# Patient Record
Sex: Male | Born: 1980 | Hispanic: Yes | Marital: Single | Smoking: Current some day smoker
Health system: Southern US, Community
[De-identification: ages and names within clinical notes are randomized; demographics above are authoritative.]

## PROBLEM LIST (undated history)

## (undated) ENCOUNTER — Emergency Department (HOSPITAL_COMMUNITY): Payer: Self-pay | Source: Home / Self Care

## (undated) DIAGNOSIS — J45909 Unspecified asthma, uncomplicated: Secondary | ICD-10-CM

---

## 2002-02-26 ENCOUNTER — Emergency Department (HOSPITAL_COMMUNITY): Admission: EM | Admit: 2002-02-26 | Discharge: 2002-02-26 | Payer: Self-pay | Admitting: Emergency Medicine

## 2002-03-06 ENCOUNTER — Emergency Department (HOSPITAL_COMMUNITY): Admission: EM | Admit: 2002-03-06 | Discharge: 2002-03-06 | Payer: Self-pay | Admitting: *Deleted

## 2006-09-22 ENCOUNTER — Emergency Department (HOSPITAL_COMMUNITY): Admission: EM | Admit: 2006-09-22 | Discharge: 2006-09-22 | Payer: Self-pay | Admitting: Surgery

## 2014-02-28 ENCOUNTER — Other Ambulatory Visit: Payer: Self-pay

## 2014-02-28 ENCOUNTER — Emergency Department (HOSPITAL_COMMUNITY): Payer: Self-pay

## 2014-02-28 ENCOUNTER — Emergency Department (HOSPITAL_COMMUNITY)
Admission: EM | Admit: 2014-02-28 | Discharge: 2014-02-28 | Disposition: A | Discharge status: Home / Self Care | Payer: Self-pay | Attending: Emergency Medicine | Admitting: Emergency Medicine

## 2014-02-28 ENCOUNTER — Encounter (HOSPITAL_COMMUNITY): Payer: Self-pay | Admitting: Emergency Medicine

## 2014-02-28 DIAGNOSIS — Z23 Encounter for immunization: Secondary | ICD-10-CM | POA: Insufficient documentation

## 2014-02-28 DIAGNOSIS — Z792 Long term (current) use of antibiotics: Secondary | ICD-10-CM | POA: Insufficient documentation

## 2014-02-28 DIAGNOSIS — F17200 Nicotine dependence, unspecified, uncomplicated: Secondary | ICD-10-CM | POA: Insufficient documentation

## 2014-02-28 DIAGNOSIS — S21109A Unspecified open wound of unspecified front wall of thorax without penetration into thoracic cavity, initial encounter: Secondary | ICD-10-CM

## 2014-02-28 DIAGNOSIS — S31109A Unspecified open wound of abdominal wall, unspecified quadrant without penetration into peritoneal cavity, initial encounter: Secondary | ICD-10-CM | POA: Insufficient documentation

## 2014-02-28 DIAGNOSIS — T148XXA Other injury of unspecified body region, initial encounter: Secondary | ICD-10-CM

## 2014-02-28 DIAGNOSIS — S61509A Unspecified open wound of unspecified wrist, initial encounter: Secondary | ICD-10-CM | POA: Insufficient documentation

## 2014-02-28 DIAGNOSIS — Z79899 Other long term (current) drug therapy: Secondary | ICD-10-CM | POA: Insufficient documentation

## 2014-02-28 LAB — I-STAT CHEM 8, ED
BUN: 6 mg/dL (ref 6–23)
Calcium, Ion: 1.17 mmol/L (ref 1.12–1.23)
Chloride: 105 mEq/L (ref 96–112)
Creatinine, Ser: 1.2 mg/dL (ref 0.50–1.35)
Glucose, Bld: 98 mg/dL (ref 70–99)
HEMATOCRIT: 50 % (ref 39.0–52.0)
HEMOGLOBIN: 17 g/dL (ref 13.0–17.0)
POTASSIUM: 3.6 meq/L — AB (ref 3.7–5.3)
SODIUM: 143 meq/L (ref 137–147)
TCO2: 22 mmol/L (ref 0–100)

## 2014-02-28 LAB — BASIC METABOLIC PANEL
Anion gap: 18 — ABNORMAL HIGH (ref 5–15)
BUN: 7 mg/dL (ref 6–23)
CO2: 22 mEq/L (ref 19–32)
Calcium: 9.4 mg/dL (ref 8.4–10.5)
Chloride: 104 mEq/L (ref 96–112)
Creatinine, Ser: 1.08 mg/dL (ref 0.50–1.35)
Glucose, Bld: 94 mg/dL (ref 70–99)
Potassium: 3.8 mEq/L (ref 3.7–5.3)
SODIUM: 144 meq/L (ref 137–147)

## 2014-02-28 LAB — CBC WITH DIFFERENTIAL/PLATELET
BASOS ABS: 0.1 10*3/uL (ref 0.0–0.1)
BASOS PCT: 1 % (ref 0–1)
Eosinophils Absolute: 0.1 10*3/uL (ref 0.0–0.7)
Eosinophils Relative: 2 % (ref 0–5)
HCT: 46.4 % (ref 39.0–52.0)
Hemoglobin: 16.6 g/dL (ref 13.0–17.0)
LYMPHS ABS: 1.9 10*3/uL (ref 0.7–4.0)
Lymphocytes Relative: 23 % (ref 12–46)
MCH: 34.5 pg — AB (ref 26.0–34.0)
MCHC: 35.8 g/dL (ref 30.0–36.0)
MCV: 96.5 fL (ref 78.0–100.0)
Monocytes Absolute: 0.7 10*3/uL (ref 0.1–1.0)
Monocytes Relative: 8 % (ref 3–12)
Neutro Abs: 5.4 10*3/uL (ref 1.7–7.7)
Neutrophils Relative %: 66 % (ref 43–77)
PLATELETS: 285 10*3/uL (ref 150–400)
RBC: 4.81 MIL/uL (ref 4.22–5.81)
RDW: 12.8 % (ref 11.5–15.5)
WBC: 8.2 10*3/uL (ref 4.0–10.5)

## 2014-02-28 LAB — PREPARE FRESH FROZEN PLASMA
UNIT DIVISION: 0
Unit division: 0

## 2014-02-28 LAB — TYPE AND SCREEN
ABO/RH(D): O POS
ANTIBODY SCREEN: NEGATIVE
UNIT DIVISION: 0
Unit division: 0

## 2014-02-28 LAB — ABO/RH: ABO/RH(D): O POS

## 2014-02-28 MED ORDER — HYDROCODONE-ACETAMINOPHEN 5-325 MG PO TABS: 1.0000 | ORAL_TABLET | ORAL | Status: AC | PRN

## 2014-02-28 MED ORDER — CEPHALEXIN 250 MG PO CAPS
500.0000 mg | ORAL_CAPSULE | Freq: Once | ORAL | Status: AC
  Administered 2014-02-28: 500 mg via ORAL
  Filled 2014-02-28: qty 2

## 2014-02-28 MED ORDER — SODIUM CHLORIDE 0.9 % IV SOLN
Freq: Once | INTRAVENOUS | Status: AC
  Administered 2014-02-28: 15:00:00 via INTRAVENOUS

## 2014-02-28 MED ORDER — FENTANYL CITRATE 0.05 MG/ML IJ SOLN
100.0000 ug | Freq: Once | INTRAMUSCULAR | Status: AC
  Administered 2014-02-28: 100 ug via INTRAVENOUS
  Filled 2014-02-28: qty 2

## 2014-02-28 MED ORDER — CEPHALEXIN 500 MG PO CAPS: 500.0000 mg | ORAL_CAPSULE | Freq: Four times a day (QID) | ORAL | Status: AC

## 2014-02-28 MED ORDER — ONDANSETRON HCL 4 MG/2ML IJ SOLN
4.0000 mg | Freq: Once | INTRAMUSCULAR | Status: AC
  Administered 2014-02-28: 4 mg via INTRAVENOUS
  Filled 2014-02-28: qty 2

## 2014-02-28 MED ORDER — TETANUS-DIPHTH-ACELL PERTUSSIS 5-2.5-18.5 LF-MCG/0.5 IM SUSP
0.5000 mL | Freq: Once | INTRAMUSCULAR | Status: AC
  Administered 2014-02-28: 0.5 mL via INTRAMUSCULAR
  Filled 2014-02-28: qty 0.5

## 2014-02-28 MED ORDER — IOHEXOL 300 MG/ML  SOLN
100.0000 mL | Freq: Once | INTRAMUSCULAR | Status: AC | PRN
  Administered 2014-02-28: 100 mL via INTRAVENOUS

## 2014-02-28 NOTE — ED Notes (Signed)
Pt returned from CT scanner

## 2014-02-28 NOTE — ED Notes (Signed)
Pt transported to CT ?

## 2014-02-28 NOTE — ED Notes (Signed)
Dr. Fayrene FearingJames in to suture lac.  Pt remains alert and oriented x's 3, skin warm and dry.  Color appropriate, vitals remain stable.

## 2014-02-28 NOTE — ED Notes (Signed)
Pt alert and oriented.  Pt tolerating pain of 5/10, see MAR.  Bleeding controlled to the abdomen and right wrist by gauze dressings.
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
## 2014-02-28 NOTE — Discharge Instructions (Signed)
Suture removal in 10 days. Return to emergency with any difficulty breathing, dizziness lightheadedness, abdominal pain, or other symptoms.  Laceration Care, Adult A laceration is a cut or lesion that goes through all layers of the skin and into the tissue just beneath the skin. TREATMENT  Some lacerations may not require closure. Some lacerations may not be able to be closed due to an increased risk of infection. It is important to see your caregiver as soon as possible after an injury to minimize the risk of infection and maximize the opportunity for successful closure. If closure is appropriate, pain medicines may be given, if needed. The wound will be cleaned to help prevent infection. Your caregiver will use stitches (sutures), staples, wound glue (adhesive), or skin adhesive strips to repair the laceration. These tools bring the skin edges together to allow for faster healing and a better cosmetic outcome. However, all wounds will heal with a scar. Once the wound has healed, scarring can be minimized by covering the wound with sunscreen during the day for 1 full year. HOME CARE INSTRUCTIONS  For sutures or staples:  Keep the wound clean and dry.  If you were given a bandage (dressing), you should change it at least once a day. Also, change the dressing if it becomes wet or dirty, or as directed by your caregiver.  Wash the wound with soap and water 2 times a day. Rinse the wound off with water to remove all soap. Pat the wound dry with a clean towel.  After cleaning, apply a thin layer of the antibiotic ointment as recommended by your caregiver. This will help prevent infection and keep the dressing from sticking.  You may shower as usual after the first 24 hours. Do not soak the wound in water until the sutures are removed.  Only take over-the-counter or prescription medicines for pain, discomfort, or fever as directed by your caregiver.  Get your sutures or staples removed as directed  by your caregiver. For skin adhesive strips:  Keep the wound clean and dry.  Do not get the skin adhesive strips wet. You may bathe carefully, using caution to keep the wound dry.  If the wound gets wet, pat it dry with a clean towel.  Skin adhesive strips will fall off on their own. You may trim the strips as the wound heals. Do not remove skin adhesive strips that are still stuck to the wound. They will fall off in time. For wound adhesive:  You may briefly wet your wound in the shower or bath. Do not soak or scrub the wound. Do not swim. Avoid periods of heavy perspiration until the skin adhesive has fallen off on its own. After showering or bathing, gently pat the wound dry with a clean towel.  Do not apply liquid medicine, cream medicine, or ointment medicine to your wound while the skin adhesive is in place. This may loosen the film before your wound is healed.  If a dressing is placed over the wound, be careful not to apply tape directly over the skin adhesive. This may cause the adhesive to be pulled off before the wound is healed.  Avoid prolonged exposure to sunlight or tanning lamps while the skin adhesive is in place. Exposure to ultraviolet light in the first year will darken the scar.  The skin adhesive will usually remain in place for 5 to 10 days, then naturally fall off the skin. Do not pick at the adhesive film. You may need a tetanus  shot if:  You cannot remember when you had your last tetanus shot.  You have never had a tetanus shot. If you get a tetanus shot, your arm may swell, get red, and feel warm to the touch. This is common and not a problem. If you need a tetanus shot and you choose not to have one, there is a rare chance of getting tetanus. Sickness from tetanus can be serious. SEEK MEDICAL CARE IF:   You have redness, swelling, or increasing pain in the wound.  You see a red line that goes away from the wound.  You have yellowish-white fluid (pus) coming  from the wound.  You have a fever.  You notice a bad smell coming from the wound or dressing.  Your wound breaks open before or after sutures have been removed.  You notice something coming out of the wound such as wood or glass.  Your wound is on your hand or foot and you cannot move a finger or toe. SEEK IMMEDIATE MEDICAL CARE IF:   Your pain is not controlled with prescribed medicine.  You have severe swelling around the wound causing pain and numbness or a change in color in your arm, hand, leg, or foot.  Your wound splits open and starts bleeding.  You have worsening numbness, weakness, or loss of function of any joint around or beyond the wound.  You develop painful lumps near the wound or on the skin anywhere on your body. MAKE SURE YOU:   Understand these instructions.  Will watch your condition.  Will get help right away if you are not doing well or get worse. Document Released: 07/28/2005 Document Revised: 10/20/2011 Document Reviewed: 01/21/2011 [facility] Patient Information 2015 [place], [place]. This information is not intended to replace advice given to you by your health care provider. Make sure you discuss any questions you have with your health care provider.

## 2014-02-28 NOTE — ED Notes (Signed)
Cleaned patient wounds and applied 4x4 gauze on areas with skin tape.
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
## 2014-03-01 NOTE — Consult Note (Signed)
Agree with above 

## 2014-03-12 ENCOUNTER — Encounter (HOSPITAL_COMMUNITY): Payer: Self-pay | Admitting: Emergency Medicine

## 2014-03-12 ENCOUNTER — Emergency Department (HOSPITAL_COMMUNITY)
Admission: EM | Admit: 2014-03-12 | Discharge: 2014-03-12 | Disposition: A | Discharge status: Home / Self Care | Payer: Self-pay | Attending: Emergency Medicine | Admitting: Emergency Medicine

## 2014-03-12 DIAGNOSIS — F17200 Nicotine dependence, unspecified, uncomplicated: Secondary | ICD-10-CM | POA: Insufficient documentation

## 2014-03-12 DIAGNOSIS — Z87828 Personal history of other (healed) physical injury and trauma: Secondary | ICD-10-CM | POA: Insufficient documentation

## 2014-03-12 DIAGNOSIS — Z4802 Encounter for removal of sutures: Secondary | ICD-10-CM | POA: Insufficient documentation
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
## 2014-03-12 NOTE — ED Provider Notes (Signed)
Medical screening examination/treatment/procedure(s) were performed by non-physician practitioner and as supervising physician I was immediately available for consultation/collaboration.   EKG Interpretation None       Doug SouSam Elester Apodaca, MD 03/12/14 [id number]

## 2014-03-12 NOTE — Discharge Instructions (Signed)

## 2014-04-10 ENCOUNTER — Encounter (HOSPITAL_COMMUNITY): Payer: Self-pay | Admitting: Emergency Medicine
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
## 2015-02-16 ENCOUNTER — Encounter (HOSPITAL_COMMUNITY): Payer: Self-pay | Admitting: *Deleted

## 2015-02-16 ENCOUNTER — Emergency Department (HOSPITAL_COMMUNITY)
Admission: EM | Admit: 2015-02-16 | Discharge: 2015-02-16 | Disposition: A | Discharge status: Home / Self Care | Payer: No Typology Code available for payment source | Attending: Emergency Medicine | Admitting: Emergency Medicine

## 2015-02-16 DIAGNOSIS — Z792 Long term (current) use of antibiotics: Secondary | ICD-10-CM | POA: Insufficient documentation

## 2015-02-16 DIAGNOSIS — Y998 Other external cause status: Secondary | ICD-10-CM | POA: Insufficient documentation

## 2015-02-16 DIAGNOSIS — M542 Cervicalgia: Secondary | ICD-10-CM

## 2015-02-16 DIAGNOSIS — Y9389 Activity, other specified: Secondary | ICD-10-CM | POA: Diagnosis not present

## 2015-02-16 DIAGNOSIS — S4991XA Unspecified injury of right shoulder and upper arm, initial encounter: Secondary | ICD-10-CM | POA: Insufficient documentation

## 2015-02-16 DIAGNOSIS — Y92410 Unspecified street and highway as the place of occurrence of the external cause: Secondary | ICD-10-CM | POA: Insufficient documentation

## 2015-02-16 DIAGNOSIS — S199XXA Unspecified injury of neck, initial encounter: Secondary | ICD-10-CM | POA: Insufficient documentation

## 2015-02-16 DIAGNOSIS — Z720 Tobacco use: Secondary | ICD-10-CM | POA: Diagnosis not present

## 2015-02-16 DIAGNOSIS — M25511 Pain in right shoulder: Secondary | ICD-10-CM

## 2015-02-16 DIAGNOSIS — S299XXA Unspecified injury of thorax, initial encounter: Secondary | ICD-10-CM | POA: Insufficient documentation

## 2015-02-16 MED ORDER — CYCLOBENZAPRINE HCL 10 MG PO TABS: 10.0000 mg | ORAL_TABLET | Freq: Two times a day (BID) | ORAL | Status: AC | PRN

## 2015-02-16 NOTE — Discharge Instructions (Signed)
Arthralgia Arthralgia is joint pain. A joint is a place where two bones meet. Joint pain can happen for many reasons. The joint can be bruised, stiff, infected, or weak from aging. Pain usually goes away after resting and taking medicine for soreness.  HOME CARE  Rest the joint as told by your doctor.  Keep the sore joint raised (elevated) for the first 24 hours.  Put ice on the joint area.  Put ice in a plastic bag.  Place a towel between your skin and the bag.  Leave the ice on for 15-20 minutes, 03-04 times a day.  Wear your splint, casting, elastic bandage, or sling as told by your doctor.  Only take medicine as told by your doctor. Do not take aspirin.  Use crutches as told by your doctor. Do not put weight on the joint until told to by your doctor. GET HELP RIGHT AWAY IF:   You have bruising, puffiness (swelling), or more pain.  Your fingers or toes turn blue or start to lose feeling (numb).  Your medicine does not lessen the pain.  Your pain becomes severe.  You have a temperature by mouth above 102 F (38.9 C), not controlled by medicine.  You cannot move or use the joint. MAKE SURE YOU:   Understand these instructions.  Will watch your condition.  Will get help right away if you are not doing well or get worse. Document Released: 07/16/2009 Document Revised: 10/20/2011 Document Reviewed: 07/16/2009 [facility] Patient Information 2015 [place]. This information is not intended to replace advice given to you by your health care provider. Make sure you discuss any questions you have with your health care provider.  Please use ibuprofen and Flexeril as needed for pain. Please follow-up with orthopedic specialist if symptoms continue to persist.
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
## 2018-01-10 ENCOUNTER — Emergency Department (HOSPITAL_COMMUNITY)
Admission: EM | Admit: 2018-01-10 | Discharge: 2018-01-11 | Disposition: A | Discharge status: Home / Self Care | Payer: Self-pay | Attending: Emergency Medicine | Admitting: Emergency Medicine

## 2018-01-10 ENCOUNTER — Encounter (HOSPITAL_COMMUNITY): Payer: Self-pay | Admitting: *Deleted

## 2018-01-10 ENCOUNTER — Emergency Department (HOSPITAL_COMMUNITY): Payer: Self-pay

## 2018-01-10 DIAGNOSIS — R079 Chest pain, unspecified: Secondary | ICD-10-CM

## 2018-01-10 DIAGNOSIS — Z79899 Other long term (current) drug therapy: Secondary | ICD-10-CM | POA: Insufficient documentation

## 2018-01-10 DIAGNOSIS — Z87891 Personal history of nicotine dependence: Secondary | ICD-10-CM | POA: Insufficient documentation

## 2018-01-10 DIAGNOSIS — R0789 Other chest pain: Secondary | ICD-10-CM | POA: Insufficient documentation

## 2018-01-10 DIAGNOSIS — J45909 Unspecified asthma, uncomplicated: Secondary | ICD-10-CM | POA: Insufficient documentation

## 2018-01-10 HISTORY — DX: Unspecified asthma, uncomplicated: J45.909

## 2018-01-10 LAB — BASIC METABOLIC PANEL
Anion gap: 10 (ref 5–15)
BUN: 17 mg/dL (ref 6–20)
CO2: 23 mmol/L (ref 22–32)
Calcium: 8.7 mg/dL — ABNORMAL LOW (ref 8.9–10.3)
Chloride: 107 mmol/L (ref 101–111)
Creatinine, Ser: 1.07 mg/dL (ref 0.61–1.24)
GFR calc Af Amer: >60 mL/min (ref >60)
GFR calc non Af Amer: >60 mL/min (ref >60)
GLUCOSE: 105 mg/dL — AB (ref 65–99)
Potassium: 3.9 mmol/L (ref 3.5–5.1)
Sodium: 140 mmol/L (ref 135–145)

## 2018-01-10 LAB — I-STAT TROPONIN, ED: Troponin i, poc: 0 ng/mL (ref 0.00–0.08)

## 2018-01-10 LAB — CBC
HCT: 41.6 % (ref 39.0–52.0)
Hemoglobin: 14.8 g/dL (ref 13.0–17.0)
MCH: 32.7 pg (ref 26.0–34.0)
MCHC: 35.6 g/dL (ref 30.0–36.0)
MCV: 92 fL (ref 78.0–100.0)
Platelets: 261 10*3/uL (ref 150–400)
RBC: 4.52 MIL/uL (ref 4.22–5.81)
RDW: 12.6 % (ref 11.5–15.5)
WBC: 8.1 10*3/uL (ref 4.0–10.5)

## 2018-01-10 NOTE — ED Triage Notes (Signed)
Pt c/o chest pain x 2 months.  Pt denies radiation, pain is intermittent.

## 2018-01-11 MED ORDER — RANITIDINE HCL 150 MG PO TABS: 150.0000 mg | ORAL_TABLET | Freq: Two times a day (BID) | ORAL | 0 refills | Status: AC

## 2018-01-11 NOTE — ED Provider Notes (Signed)
[facility]-EMERGENCY DEPT Provider Note   CSN: [id number] Arrival date & time: 01/10/18  2119     History   Chief Complaint Chief Complaint  Patient presents with  . Chest Pain    HPI Ray Kirk is a 37 y.o. male.  Patient presents to the emergency department for evaluation of chest pain.  He reports that he used to have pain frequently several months ago.  He started watching his diet, quit smoking, quit drinking and quit using cocaine.  The symptoms tapered off and then resolved.  Today, however, he is been experiencing intermittent episodes of throbbing pain in the left chest with.  No nausea, vomiting, diaphoresis.  He reports that when the pain started he became very anxious and felt short of breath, but that resolved.  No family history of first-degree relatives with coronary artery disease.     Past Medical History:  Diagnosis Date  . Asthma     There are no active problems to display for this patient.   History reviewed. No pertinent surgical history.      Home Medications    Prior to Admission medications   Medication Sig Start Date End Date Taking? Authorizing Provider  cephALEXin (KEFLEX) 500 MG capsule Take 1 capsule (500 mg total) by mouth 4 (four) times daily. 02/28/14   Rolland Porter, MD  clonazePAM (KLONOPIN) 1 MG tablet Take 1 mg by mouth daily as needed for anxiety.    [provider]  cyclobenzaprine (FLEXERIL) 10 MG tablet Take 1 tablet (10 mg total) by mouth 2 (two) times daily as needed for muscle spasms. 02/16/15   Hedges, Tinnie Gens, PA-C  HYDROcodone-acetaminophen (NORCO/VICODIN) 5-325 MG per tablet Take 1 tablet by mouth every 4 (four) hours as needed. 02/28/14   Rolland Porter, MD  ranitidine (ZANTAC) 150 MG tablet Take 1 tablet (150 mg total) by mouth 2 (two) times daily. 01/11/18   Gilda Crease, MD    Family History No family history on file.  Social History Social History   Tobacco Use  . Smoking  status: Former Smoker  Substance Use Topics  . Alcohol use: Not Currently    Comment: socially  . Drug use: Not Currently    Types: Cocaine    Comment: Last night used cocaine     Allergies   Patient has no known allergies.   Review of Systems Review of Systems  Cardiovascular: Positive for chest pain.  All other systems reviewed and are negative.    Physical Exam Updated Vital Signs BP 119/72 (BP Location: Right Arm)   Pulse 63   Temp 98.4 F (36.9 C) (Oral)   Resp 16   Ht 5\' 11"  (1.803 m)   Wt 101.2 kg (223 lb)   SpO2 100%   BMI 31.10 kg/m   Physical Exam  Constitutional: He is oriented to person, place, and time. He appears well-developed and well-nourished. No distress.  HENT:  Head: Normocephalic and atraumatic.  Right Ear: Hearing normal.  Left Ear: Hearing normal.  Nose: Nose normal.  Mouth/Throat: Oropharynx is clear and moist and mucous membranes are normal.  Eyes: Pupils are equal, round, and reactive to light. Conjunctivae and EOM are normal.  Neck: Normal range of motion. Neck supple.  Cardiovascular: Regular rhythm, S1 normal and S2 normal. Exam reveals no gallop and no friction rub.  No murmur heard. Pulmonary/Chest: Effort normal and breath sounds normal. No respiratory distress. He exhibits no tenderness.  Abdominal: Soft. Normal appearance and bowel sounds  are normal. There is no hepatosplenomegaly. There is no tenderness. There is no rebound, no guarding, no tenderness at McBurney's point and negative Murphy's sign. No hernia.  Musculoskeletal: Normal range of motion.  Neurological: He is alert and oriented to person, place, and time. He has normal strength. No cranial nerve deficit or sensory deficit. Coordination normal. GCS eye subscore is 4. GCS verbal subscore is 5. GCS motor subscore is 6.  Skin: Skin is warm, dry and intact. No rash noted. No cyanosis.  Psychiatric: He has a normal mood and affect. His speech is normal and behavior is  normal. Thought content normal.  Nursing note and vitals reviewed.    ED Treatments / Results  Labs (all labs ordered are listed, but only abnormal results are displayed) Labs Reviewed  BASIC METABOLIC PANEL - Abnormal; Notable for the following components:      Result Value   Glucose, Bld 105 (*)    Calcium 8.7 (*)    All other components within normal limits  CBC  I-STAT TROPONIN, ED    EKG EKG Interpretation  Date/Time:  "Sunday January 10 2018 21:32:21 EDT Ventricular Rate:  71 PR Interval:    QRS Duration: 83 QT Interval:  389 QTC Calculation: 423 R Axis:   80 Text Interpretation:  Sinus rhythm RSR' in V1 or V2, probably normal variant No previous tracing Confirmed by Arcenia Scarbro J ([id number]) on 01/10/2018 11:59:03 PM   Radiology Dg Chest 2 View  Result Date: 01/10/2018 CLINICAL DATA:  Intermittent upper chest pain for 2 months, palpitations. Smoker, recently quit. EXAM: CHEST - 2 VIEW COMPARISON:  None. FINDINGS: The heart size and mediastinal contours are within normal limits. Both lungs are clear. The visualized skeletal structures are unremarkable. IMPRESSION: Negative. Electronically Signed   By: Courtnay  Bloomer M.D.   On: 01/10/2018 22:13    Procedures Procedures (including critical care time)  Medications Ordered in ED Medications - No data to display   Initial Impression / Assessment and Plan / ED Course  I have reviewed the triage vital signs and the nursing notes.  Pertinent labs & imaging results that were available during my care of the patient were reviewed by me and considered in my medical decision making (see chart for details).     Patient with atypical chest pain, began at rest today.  He does not have shortness of breath currently, heart rate is 63, no tachypnea, tachycardia, hypoxia.  No PE risk factors, does not need PE work-up.  PERC negative.  Patient does have a cocaine use history, but has not used in several months.  EKG does not  suggest ischemia or infarct.  Troponin negative.  Chest x-ray clear.  Patient is felt to be extremely low risk, appropriate for outpatient follow-up.  Does not have a PCP, will refer to cardiology for outpatient evaluation.  Initiate ranitidine twice daily.  Final Clinical Impressions(s) / ED Diagnoses   Final diagnoses:  Chest pain, unspecified type    ED Discharge Orders        Ordered    ranitidine (ZANTAC) 150 MG tablet  2 times daily     06" /03/19 0011       Gilda CreasePollina, Lamarcus Spira J, MD 01/11/18 0011

## 2019-05-15 NOTE — Progress Notes (Deleted)
Cardiology Office Note:    Date:  05/15/2019   ID:  Ray Kirk, DOB [birth year], MRN [id number]  PCP:  Patient, No Pcp Per  Cardiologist:  No primary care provider on file.  Electrophysiologist:  None   Referring MD: No ref. provider found   No chief complaint on file. ***  History of Present Illness:    Ray Kirk is a 38 y.o. male with a hx of tobacco use, cocaine use who presents for an evaluation of chest pain.  He had a ED visit on 01/10/2018 for chest pain. Reported intermittent episodes of throbbing pain on left side of chest.  Work-up in the ED was unremarkable, with no evidence of ischemia/infarct on EKG and negative troponin.  He was started on ranitidine.  No family history of CAD.    Past Medical History:  Diagnosis Date  . Asthma     No past surgical history on file.  Current Medications: No outpatient medications have been marked as taking for the 05/16/19 encounter (Appointment) with Donato Heinz, MD.     Allergies:   Patient has no known allergies.   Social History   Socioeconomic History  . Marital status: Single    Spouse name: Not on file  . Number of children: Not on file  . Years of education: Not on file  . Highest education level: Not on file  Occupational History  . Not on file  Social Needs  . Financial resource strain: Not on file  . Food insecurity    Worry: Not on file    Inability: Not on file  . Transportation needs    Medical: Not on file    Non-medical: Not on file  Tobacco Use  . Smoking status: Former Smoker  Substance and Sexual Activity  . Alcohol use: Not Currently    Comment: socially  . Drug use: Not Currently    Types: Cocaine    Comment: Last night used cocaine  . Sexual activity: Not on file  Lifestyle  . Physical activity    Days per week: Not on file    Minutes per session: Not on file  . Stress: Not on file  Relationships  . Social Herbalist on phone: Not on file    Gets  together: Not on file    Attends religious service: Not on file    Active member of club or organization: Not on file    Attends meetings of clubs or organizations: Not on file    Relationship status: Not on file  Other Topics Concern  . Not on file  Social History Narrative   ** Merged History Encounter **         Family History: The patient's ***family history is not on file.  ROS:   Please see the history of present illness.    *** All other systems reviewed and are negative.  EKGs/Labs/Other Studies Reviewed:    The following studies were reviewed today: ***  EKG:  EKG is *** ordered today.  The ekg ordered today demonstrates ***  Recent Labs: No results found for requested labs within last 8760 hours.  Recent Lipid Panel No results found for: CHOL, TRIG, HDL, CHOLHDL, VLDL, LDLCALC, LDLDIRECT  Physical Exam:    VS:  There were no vitals taken for this visit.    Wt Readings from Last 3 Encounters:  02/16/15 194 lb 8 oz (88.2 kg)  03/12/14 80 lb (36.3 kg)  02/28/14 180 lb (  81.6 kg)     GEN: *** Well nourished, well developed in no acute distress HEENT: Normal NECK: No JVD; No carotid bruits LYMPHATICS: No lymphadenopathy CARDIAC: ***RRR, no murmurs, rubs, gallops RESPIRATORY:  Clear to auscultation without rales, wheezing or rhonchi  ABDOMEN: Soft, non-tender, non-distended MUSCULOSKELETAL:  No edema; No deformity  SKIN: Warm and dry NEUROLOGIC:  Alert and oriented x 3 PSYCHIATRIC:  Normal affect   ASSESSMENT:    No diagnosis found. PLAN:    In order of problems listed above:  Chest pain  Medication Adjustments/Labs and Tests Ordered: Current medicines are reviewed at length with the patient today.  Concerns regarding medicines are outlined above.  No orders of the defined types were placed in this encounter.  No orders of the defined types were placed in this encounter.   There are no Patient Instructions on file for this visit.   Signed,  Little Ishikawa, MD  05/15/2019 8:55 PM     [facility]

## 2019-05-16 ENCOUNTER — Ambulatory Visit: Payer: Self-pay | Admitting: Cardiology

## 2020-01-10 ENCOUNTER — Other Ambulatory Visit: Payer: Self-pay

## 2020-01-10 ENCOUNTER — Emergency Department (HOSPITAL_COMMUNITY)
Admission: EM | Admit: 2020-01-10 | Discharge: 2020-01-10 | Discharge status: Against Medical Advice | Payer: Self-pay | Attending: Emergency Medicine | Admitting: Emergency Medicine

## 2020-01-10 ENCOUNTER — Encounter (HOSPITAL_COMMUNITY): Payer: Self-pay | Admitting: Emergency Medicine

## 2020-01-10 DIAGNOSIS — Y999 Unspecified external cause status: Secondary | ICD-10-CM | POA: Insufficient documentation

## 2020-01-10 DIAGNOSIS — Z5320 Procedure and treatment not carried out because of patient's decision for unspecified reasons: Secondary | ICD-10-CM | POA: Insufficient documentation

## 2020-01-10 DIAGNOSIS — S61312A Laceration without foreign body of right middle finger with damage to nail, initial encounter: Secondary | ICD-10-CM | POA: Insufficient documentation

## 2020-01-10 DIAGNOSIS — W290XXA Contact with powered kitchen appliance, initial encounter: Secondary | ICD-10-CM | POA: Insufficient documentation

## 2020-01-10 DIAGNOSIS — Y93G1 Activity, food preparation and clean up: Secondary | ICD-10-CM | POA: Insufficient documentation

## 2020-01-10 DIAGNOSIS — Z720 Tobacco use: Secondary | ICD-10-CM | POA: Insufficient documentation

## 2020-01-10 DIAGNOSIS — Y92030 Kitchen in apartment as the place of occurrence of the external cause: Secondary | ICD-10-CM | POA: Insufficient documentation

## 2020-01-10 MED ORDER — TETANUS-DIPHTH-ACELL PERTUSSIS 5-2.5-18.5 LF-MCG/0.5 IM SUSP
0.5000 mL | Freq: Once | INTRAMUSCULAR | Status: DC
  Filled 2020-01-10: qty 0.5

## 2020-01-10 NOTE — ED Provider Notes (Signed)
[facility] EMERGENCY DEPARTMENT Provider Note   CSN: [id number] Arrival date & time: 01/10/20  7829     History Chief Complaint  Patient presents with  . Laceration    Ray Kirk is a 39 y.o. male.  HPI  39 year old male with laceration to right third digit yesterday while using a slicer at home.  He states he initially cleaned with water and alcohol.  He put a dressing on it.  He began bleeding during the night and came in secondary to this.  He denies numbness, tingling, or other injury.  He does not know when his last tetanus shot was.     Past Medical History:  Diagnosis Date  . Asthma     There are no problems to display for this patient.   History reviewed. No pertinent surgical history.     No family history on file.  Social History   Tobacco Use  . Smoking status: Current Some Day Smoker  . Smokeless tobacco: Never Used  Substance Use Topics  . Alcohol use: Yes    Comment: socially  . Drug use: Yes    Types: Cocaine    Comment: Last night used cocaine    Home Medications Prior to Admission medications   Medication Sig Start Date End Date Taking? Authorizing Provider  cephALEXin (KEFLEX) 500 MG capsule Take 1 capsule (500 mg total) by mouth 4 (four) times daily. 02/28/14   Rolland Porter, MD  clonazePAM (KLONOPIN) 1 MG tablet Take 1 mg by mouth daily as needed for anxiety.    [provider]  cyclobenzaprine (FLEXERIL) 10 MG tablet Take 1 tablet (10 mg total) by mouth 2 (two) times daily as needed for muscle spasms. 02/16/15   Hedges, Tinnie Gens, PA-C  HYDROcodone-acetaminophen (NORCO/VICODIN) 5-325 MG per tablet Take 1 tablet by mouth every 4 (four) hours as needed. 02/28/14   Rolland Porter, MD  ranitidine (ZANTAC) 150 MG tablet Take 1 tablet (150 mg total) by mouth 2 (two) times daily. 01/11/18   Gilda Crease, MD    Allergies    Patient has no known allergies.  Review of Systems   Review of Systems  All other systems  reviewed and are negative.   Physical Exam Updated Vital Signs BP 123/84 (BP Location: Right Arm)   Pulse 68   Temp 97.7 F (36.5 C) (Oral)   Resp 16   Ht 1.803 m (5\' 11" )   Wt 105 kg   SpO2 98%   BMI 32.29 kg/m   Physical Exam Vitals and nursing note reviewed.  Constitutional:      Appearance: Normal appearance.  HENT:     Head: Normocephalic.     Right Ear: External ear normal.     Left Ear: External ear normal.     Nose: Nose normal.  Cardiovascular:     Rate and Rhythm: Normal rate.  Musculoskeletal:     Comments: Right third digit with soft tissue defect distally including the nail.  Entire area is approximately 1.5 x 1.5 cm. No other injuries noted No bone is visible on exam  Skin:    General: Skin is warm.     Capillary Refill: Capillary refill takes less than 2 seconds.  Neurological:     General: No focal deficit present.     Mental Status: He is alert.  Psychiatric:        Mood and Affect: Mood normal.     ED Results / Procedures / Treatments   Labs (  all labs ordered are listed, but only abnormal results are displayed) Labs Reviewed - No data to display  EKG None  Radiology No results found.  Procedures Procedures (including critical care time)  Medications Ordered in ED Medications  Tdap (BOOSTRIX) injection 0.5 mL (has no administration in time range)    ED Course  I have reviewed the triage vital signs and the nursing notes.  Pertinent labs & imaging results that were available during my care of the patient were reviewed by me and considered in my medical decision making (see chart for details).    MDM Rules/Calculators/A&P                      Plan bulky dressing Observe for signs or symptoms of infection the need for reevaluation Follow-up with primary care doctor Final Clinical Impression(s) / ED Diagnoses Final diagnoses:  Laceration of right middle finger without foreign body with damage to nail, initial encounter    Rx /  DC Orders ED Discharge Orders    None       Pattricia Boss, MD 01/10/20 1547

## 2020-01-10 NOTE — ED Triage Notes (Signed)
Patient reports he accidentally sliced the distal tip of his right middle finger last night while slicing potatoes . Dressing applied at triage with mild bleeding .

## 2020-01-10 NOTE — Discharge Instructions (Addendum)
Please keep bulky dressing in place.  Use nonstick dressing over fingertip Return to emergency department if increased swelling, redness, or discharge Recheck of wound with your primary care doctor in 1 week

## 2020-01-10 NOTE — ED Notes (Signed)
Went to assess pt and administer Tdap. Pt had eloped without notice following Dr.Ray's assessment. Dr. Rosalia Hammers made aware. Confirmed with XR that he was not in a scan. Charge RN also made aware. Unable to update vitals due to elopement
